# Patient Record
Sex: Male | Born: 1985 | Race: White | Hispanic: No | Marital: Single | Smoking: Current every day smoker
Health system: Southern US, Community
[De-identification: ages and names within clinical notes are randomized; demographics above are authoritative.]

## PROBLEM LIST (undated history)

## (undated) DIAGNOSIS — Y249XXA Unspecified firearm discharge, undetermined intent, initial encounter: Secondary | ICD-10-CM

## (undated) DIAGNOSIS — E119 Type 2 diabetes mellitus without complications: Secondary | ICD-10-CM

## (undated) DIAGNOSIS — W3400XA Accidental discharge from unspecified firearms or gun, initial encounter: Secondary | ICD-10-CM

## (undated) HISTORY — PX: COLOSTOMY: SHX63

## (undated) HISTORY — PX: COLOSTOMY REVERSAL: SHX5782

## (undated) HISTORY — PX: COLON RESECTION: SHX5231

---

## 2014-03-15 ENCOUNTER — Encounter (HOSPITAL_BASED_OUTPATIENT_CLINIC_OR_DEPARTMENT_OTHER): Payer: Self-pay | Admitting: *Deleted

## 2014-03-15 ENCOUNTER — Emergency Department (HOSPITAL_BASED_OUTPATIENT_CLINIC_OR_DEPARTMENT_OTHER)
Admission: EM | Admit: 2014-03-15 | Discharge: 2014-03-15 | Disposition: A | Discharge status: Home / Self Care | Payer: Self-pay | Attending: Emergency Medicine | Admitting: Emergency Medicine

## 2014-03-15 DIAGNOSIS — E119 Type 2 diabetes mellitus without complications: Secondary | ICD-10-CM | POA: Insufficient documentation

## 2014-03-15 DIAGNOSIS — Z794 Long term (current) use of insulin: Secondary | ICD-10-CM | POA: Insufficient documentation

## 2014-03-15 DIAGNOSIS — Z79899 Other long term (current) drug therapy: Secondary | ICD-10-CM | POA: Insufficient documentation

## 2014-03-15 DIAGNOSIS — L03115 Cellulitis of right lower limb: Secondary | ICD-10-CM | POA: Insufficient documentation

## 2014-03-15 DIAGNOSIS — Z720 Tobacco use: Secondary | ICD-10-CM | POA: Insufficient documentation

## 2014-03-15 DIAGNOSIS — L0291 Cutaneous abscess, unspecified: Secondary | ICD-10-CM

## 2014-03-15 HISTORY — DX: Type 2 diabetes mellitus without complications: E11.9

## 2014-03-15 LAB — CBG MONITORING, ED: GLUCOSE-CAPILLARY: 334 mg/dL — AB (ref 70–99)

## 2014-03-15 MED ORDER — LIDOCAINE-EPINEPHRINE 2 %-1:100000 IJ SOLN
20.0000 mL | Freq: Once | INTRAMUSCULAR | Status: AC
  Administered 2014-03-15: 20 mL

## 2014-03-15 MED ORDER — IBUPROFEN 800 MG PO TABS: 800.0000 mg | ORAL_TABLET | Freq: Three times a day (TID) | ORAL | Status: DC

## 2014-03-15 MED ORDER — SULFAMETHOXAZOLE-TRIMETHOPRIM 800-160 MG PO TABS: 1.0000 | ORAL_TABLET | Freq: Two times a day (BID) | ORAL | Status: DC

## 2014-03-15 MED ORDER — LIDOCAINE-EPINEPHRINE 2 %-1:100000 IJ SOLN
INTRAMUSCULAR | Status: AC
  Administered 2014-03-15: 20 mL
  Filled 2014-03-15: qty 1

## 2014-03-15 NOTE — ED Notes (Signed)
Pt amb to room 2 with quick steady gait in nad. Pt reports a swollen, tender area to his groin, first noticed in the shower last night. Denies any fevers or other c/o.

## 2014-03-15 NOTE — ED Provider Notes (Signed)
CSN: [id number]     Arrival date & time 03/15/14  0734 History   First MD Initiated Contact with Patient 03/15/14 0809     Chief Complaint  Patient presents with  . abcess      (Consider location/radiation/quality/duration/timing/severity/associated sxs/prior Treatment) HPI Patient has a one day development of a swollen nodule high on his right thigh. He reports is very tender to pressure.he reports he became very uncomfortable at work last night trying to walk around. He denies any other associated symptoms. He reports once he developed an abscess on the right side of his neck but it went away on its own. No prior history of recurrent abscess or MRSA. Past Medical History  Diagnosis Date  . Diabetes mellitus without complication    History reviewed. No pertinent past surgical history. History reviewed. No pertinent family history. History  Substance Use Topics  . Smoking status: Current Every Day Smoker  . Smokeless tobacco: Not on file  . Alcohol Use: Not on file    Review of Systems Constitutional: No fevers no chills, no general malaise. GI: No nausea vomiting or diarrhea.   Allergies  Review of patient's allergies indicates no known allergies.  Home Medications   Prior to Admission medications   Medication Sig Start Date End Date Taking? Authorizing Provider  insulin NPH-regular Human (NOVOLIN 70/30) (70-30) 100 UNIT/ML injection Inject 30 Units into the skin 2 (two) times daily with a meal.   Yes Historical Provider, MD  metFORMIN (GLUCOPHAGE) 1000 MG tablet Take 1,000 mg by mouth 2 (two) times daily with a meal.   Yes Historical Provider, MD   BP 137/79 mmHg  Pulse 88  Temp(Src) 98.3 F (36.8 C) (Oral)  SpO2 96% Physical Exam  Constitutional: He is oriented to person, place, and time. He appears well-developed and well-nourished. No distress.  HENT:  Head: Normocephalic.  Eyes: EOM are normal.  Neck: Neck supple.  Pulmonary/Chest: Effort normal. No  respiratory distress.  Abdominal: Soft. He exhibits no distension. There is no tenderness.  There is no groin lymphadenopathy or scrotal erythema or swelling.  Musculoskeletal: He exhibits no edema.  The patient has a 2-1/2 cm nodule high on the right thigh medial, about 8 cm from the groin crease. This is not within the inguinal region. The remainder of the leg is normal without cellulitis. The nodule itself has fluctuance. There is a pinpoint area that appears to have had a very small amount of drainage associated with it.  Lymphadenopathy:    He has no cervical adenopathy.  Neurological: He is alert and oriented to person, place, and time.  Skin: Skin is warm and dry.  Psychiatric: He has a normal mood and affect.    ED Course  Procedures (including critical care time) Labs Review Labs Reviewed  CBG MONITORING, ED - Abnormal; Notable for the following:    Glucose-Capillary 334 (*)    All other components within normal limits  WOUND CULTURE    Imaging Review No results found.   EKG Interpretation None     Procedure: Incision and drainage. Right upper thigh was prepped with Betadine. One milliliter of 2% lidocaine with epinephrine used for anesthesia 11 blade scalpel used for incision 3 milliliters of thick pus expressed from the wound. Minimal bleeding. Wound irrigated with sterile normal saline. Packed with iodoform quarter inch gauze. Wound dressed with gauze and paper tape MDM   Final diagnoses:  Abscess   Patient has isolated skin abscess without associated cellulitis at this point  time. There is a second follicle with a half centimeter papule associated with it adjacent to the main abscess. There is no fluctuance associated with this and thus cannot be drained at this time. There is no associated groin or genital findings at this time. There are no associated constitutional findings. The patient will be treated with Bactrim and instructed for a 48 hour recheck for  packing removal or change.    Arby BarretteMarcy Brownie Gockel, MD 03/15/14 [phone]

## 2014-03-15 NOTE — Discharge Instructions (Signed)
Abscess °Care After °An abscess (also called a boil or furuncle) is an infected area that contains a collection of pus. Signs and symptoms of an abscess include pain, tenderness, redness, or hardness, or you may feel a moveable soft area under your skin. An abscess can occur anywhere in the body. The infection may spread to surrounding tissues causing cellulitis. A cut (incision) by the surgeon was made over your abscess and the pus was drained out. Gauze may have been packed into the space to provide a drain that will allow the cavity to heal from the inside outwards. The boil may be painful for 5 to 7 days. Most people with a boil do not have high fevers. Your abscess, if seen early, may not have localized, and may not have been lanced. If not, another appointment may be required for this if it does not get better on its own or with medications. °HOME CARE INSTRUCTIONS  °· Only take over-the-counter or prescription medicines for pain, discomfort, or fever as directed by your caregiver. °· When you bathe, soak and then remove gauze or iodoform packs at least daily or as directed by your caregiver. You may then wash the wound gently with mild soapy water. Repack with gauze or do as your caregiver directs. °SEEK IMMEDIATE MEDICAL CARE IF:  °· You develop increased pain, swelling, redness, drainage, or bleeding in the wound site. °· You develop signs of generalized infection including muscle aches, chills, fever, or a general ill feeling. °· An oral temperature above 102° F (38.9° C) develops, not controlled by medication. °See your caregiver for a recheck if you develop any of the symptoms described above. If medications (antibiotics) were prescribed, take them as directed. °Document Released: 11/06/2004 Document Revised: 07/13/2011 Document Reviewed: 07/04/2007 °ExitCare® Patient Information ©2015 [employer]. This information is not intended to replace advice given to you by your health care provider. Make sure  you discuss any questions you have with your health care provider. ° ° °Emergency Department Resource Guide °1) Find a Doctor and Pay Out of Pocket °Although you won't have to find out who is covered by your insurance plan, it is a good idea to ask around and get recommendations. You will then need to call the office and see if the doctor you have chosen will accept you as a new patient and what types of options they offer for patients who are self-pay. Some doctors offer discounts or will set up payment plans for their patients who do not have insurance, but you will need to ask so you aren't surprised when you get to your appointment. ° °2) Contact Your Local Health Department °Not all health departments have doctors that can see patients for sick visits, but many do, so it is worth a call to see if yours does. If you don't know where your local health department is, you can check in your phone book. The CDC also has a tool to help you locate your state's health department, and many state websites also have listings of all of their local health departments. ° °3) Find a Walk-in Clinic °If your illness is not likely to be very severe or complicated, you may want to try a walk in clinic. These are popping up all over the country in pharmacies, drugstores, and shopping centers. They're usually staffed by nurse practitioners or physician assistants that have been trained to treat common illnesses and complaints. They're usually fairly quick and inexpensive. However, if you have serious medical issues   or chronic medical problems, these are probably not your best option. ° °No Primary Care Doctor: °- Call [facility] at  832-8000 - they can help you locate a primary care doctor that  accepts your insurance, provides certain services, etc. °- Physician Referral Service- 1-800-533-3463 ° °Chronic Pain Problems: °Organization         Address  Phone   Notes  °[facility]  (336) 297-2271 Patients need  to be referred by their primary care doctor.  ° °Medication Assistance: °Organization         Address  Phone   Notes  °[place] [facility] [address] °[place], [place] [address] (336) 641-8030 --Must be a resident of [place] °-- Must have NO insurance coverage whatsoever (no Medicaid/ Medicare, etc.) °-- The pt. MUST have a primary care doctor that directs their care regularly and follows them in the community °  °[facility]  (866) 331-1348   °[employer]  (888) 892-1162   ° °Agencies that provide inexpensive medical care: °Organization         Address  Phone   Notes  °[facility]  (336) 832-8035   °[facility]    (336) 832-7272   °[facility] [address] °[place], [place] [address] (336) 832-4777   °[facility] [address], °[place] (336) 271-4999   °[facility]    (336) 373-0678   °[facility]    (336) 272-1050   °[facility] ° [address], [place] Phone:  (336) 832-4444, Fax:  (336) 832-4440 Hours of Operation:  9 am - 6 pm, M-F.  Also accepts Medicaid/Medicare and self-pay.  °[facility] ° [address], [place] Phone: (336) 832-3150, Fax: (336) 832-3151. Hours of Operation:  8:30 am - 5:30 pm, M-F.  Also accepts Medicaid and self-pay.  °[facility] [place] [address], [place] Phone: (336) 878-6027   °[facility] [address], [place], [place] (336)723-1848, Ext. 123 Mondays & Thursdays: 7-9 AM.  First 15 patients are seen on a first come, first serve basis. °  ° °Medicaid-accepting [place] Providers: ° °Organization         Address  Phone   Notes  °[facility] [address], [place] (336) 641-2100 Also accepts self-pay patients.  °[facility] [address], [place] ° (336) 856-9996   °[facility] [address], [place] (336) 288-8857   °[facility] [address] [place] [address], [place] (336) 299-7000   °Veita Bland [address], [place]  ° (336) 373-1557 Only accepts [place] Access Medicaid patients after they have their name applied to their card.  ° °Self-Pay (no insurance) in [place]: ° °Organization         Address  Phone   Notes  °Sickle Cell Patients, [facility] [address], [place] (336) 832-1970   °[facility] [address], [place] (336) 832-4400   °[facility] ° [address], [place] (336) 992-4800   °[facility]/Dr. Osei-Bonsu ° [address] [place] [address], [place] or [address], [place] (336) 841-8500 Phone number for both [place] and [place] locations is the same.  °  [facility] [address], [place] (336) 299-0000   °[facility] [address] [place] [address], [place] or [address] (336) 852-7530 °(336) 878-2260   °[facility] [address], [place] (336) 350-1642, phone; (336) 294-5005, fax Sees patients 1st and 3rd Saturday of every month.  Must not qualify for public or private insurance (i.e. Medicaid, Medicare, [place] Health Choice, Veterans' Benefits) • Household income should be no more than 200% of the poverty level •The clinic cannot treat you if you are pregnant or think you are pregnant • Sexually transmitted diseases are not treated at the clinic.  ° ° °Dental Care: °Organization         Address  Phone  Notes  °[facility] [facility] [address], [place] (336) 641-6152 Accepts children up to age 21 who are enrolled in Medicaid or [place] Health Choice; pregnant women with a Medicaid card; and children who have applied for Medicaid or [place] Health Choice, but were declined, whose  parents can pay a reduced fee at time of service.  °[facility] [place]  [address], [place] (336) 641-7733 Accepts children up to age 21 who are enrolled in Medicaid or [place] Health Choice; pregnant women with a Medicaid card; and children who have applied for Medicaid or [place] Health Choice, but were declined, whose parents can pay a reduced fee at time of service.  °[facility] ° [address], [place] (336) 641-4533 Patients are seen by appointment only. Walk-ins are not accepted. [facility] will see patients 18 years of age and older. °Monday - Tuesday (8am-5pm) °Most Wednesdays (8:30-5pm) °$30 per visit, cash only  °[facility] ° [address], [place] (336) 641-4533 Patients are seen by appointment only. Walk-ins are not accepted. [facility] will see patients 18 years of age and older. °One Wednesday Evening (Monthly: Volunteer Based).  $30 per visit, cash only  °[facility]  (919) 537-3737 for adults; Children under age 4, call Graduate Pediatric Dentistry at (919) [phone]. Children aged 4-14, please call (919) [phone] to request a pediatric application. ° Dental services are provided in all areas of dental care including fillings, crowns and bridges, complete and partial dentures, implants, gum treatment, root canals, and extractions. Preventive care is also provided. Treatment is provided to both adults and children. °Patients are selected via a lottery and there is often a waiting list. °  °[facility] [address], °[place] ° (336) 763-8833 [email or web address] °  °[facility] [address], [place],  (336)723-1848, Ext. 123 Second and Fourth Thursday of each month, opens at 6:30 AM; Clinic ends at 9 AM.  Patients are seen on a first-come first-served basis, and a limited number are seen during each clinic.  ° °[facility] °  [address], [place],  (336) 723-7904   Eligibility Requirements °You must have lived in [place], [place], or [place] counties for at least the last three months. °  You cannot be eligible for state or federal sponsored healthcare insurance, including Veterans Administration, Medicaid, or Medicare. °  You generally cannot be eligible for healthcare insurance through your employer.  °  How to apply: °Eligibility screenings are held every Tuesday and Wednesday afternoon from 1:00 pm until 4:00 pm. You do not need an appointment for the interview!  °  [facility] [address], [place], [place] 336-631-2330   °[facility]  336-342-8273   °[facility]  336-703-3100   °[facility]  336-570-6415   ° °Behavioral Health Resources in the Community: °Intensive Outpatient Programs °Organization         Address  Phone  Notes  °[facility] [address], [place], [place] 336-878-6098   °[facility] [address], [place], [place] 336-832-9800   °[facility] [address], [place], [place] ° 336-882-2125   °[facility] [address],  °[place], [place] 1-800-853-5163 or 336-641-4981   °Substance Abuse Resources °Organization         Address  Phone  Notes  °[facility]  336-882-2125   °[facility]  336-784-9470   °[facility]  336-285-9073   °[facility]  336-845-3988   °Residential & Outpatient Substance Abuse Program  1-800-659-3381   °Psychological Services °Organization         Address  Phone  Notes  °[facility]  336- 832-9600   °[facility]  336- 378-7881   °[facility] [address], [place] 1-800-853-5163 or 336-641-4981   ° °Mobile Crisis Teams °Organization         Address  Phone  Notes  °[facility], Mobile Crisis Care Unit  1-877-626-1772     °Assertive °[facility] ° [address] [place], [place] 336-834-9664   °Sharon DeEsch [address] °[place] 336-554-5454   ° °Self-Help/Support Groups °Organization         Address  Phone             Notes  °[facility] - variety of support groups  336- 373-1402 Call for more information  °Narcotics Anonymous (NA), [facility] [address], °[place]  2 meetings at this location  ° °Residential Treatment Programs °Organization         Address  Phone  Notes  °[facility] [address],    °[place]  1-866-801-8205   °[facility] ° [address], [place], [place] 704-293-8524   °[facility] [address], [place] 336-845-3988 Admissions: 8am-3pm M-F  °[facility] [address]    °[place], [place] 336-841-1104   °[facility] [address], [place], [place] 336-379-7146   °[facility] [address]  °[place], [place] 336-285-9073   °[facility] Intensive Outpatient [address], [place], [place] 336-852-3033   °[facility] [address]  °[place], [place] 1-877-615-2722 or 336-784-9470   °[facility] [address] [place], [place] 336-227-7417 Accepts Medicaid  °[facility] [address]  °[place] 1-800-659-3381 Substance Abuse/Addiction Treatment  ° °[place] Behavioral Health Resources °Organization         Address  Phone  Notes  °[facility]  (888) 581-9988   °Julie Brannon, PhD [address] [place], [place]   (336) 349-5553 or (336) 951-0000   °[facility]   [address] °[place], [place] (336) 349-4454   °[facility] [address], [place], [place] (336) 342-8316 Insurance/Medicaid/sponsorship through [facility]  °[facility] [address]                                       [place], [place] (336) 342-8316 Therapy/tele-psych/case  °[facility] [address]  ° [place], [place] (336) 349-2233    °Dr. Arfeen  (336) 349-4544   °[facility] [place]  [employer] [place] [facility] 1) [address], [place] °2) [address], [place] °3)  [address] [place] [address], [place] (336) 349-3220 °(336) 342-7768 ° °(336) 342-8140   °[place] [facility] (336) 342-1394 or (336) 342-3537 (After Hours)    ° ° ° °

## 2014-03-17 ENCOUNTER — Encounter (HOSPITAL_BASED_OUTPATIENT_CLINIC_OR_DEPARTMENT_OTHER): Payer: Self-pay | Admitting: *Deleted

## 2014-03-17 ENCOUNTER — Emergency Department (HOSPITAL_BASED_OUTPATIENT_CLINIC_OR_DEPARTMENT_OTHER)
Admission: EM | Admit: 2014-03-17 | Discharge: 2014-03-17 | Disposition: A | Discharge status: Home / Self Care | Payer: Self-pay | Attending: Emergency Medicine | Admitting: Emergency Medicine

## 2014-03-17 DIAGNOSIS — Z794 Long term (current) use of insulin: Secondary | ICD-10-CM | POA: Insufficient documentation

## 2014-03-17 DIAGNOSIS — Z09 Encounter for follow-up examination after completed treatment for conditions other than malignant neoplasm: Secondary | ICD-10-CM

## 2014-03-17 DIAGNOSIS — Z792 Long term (current) use of antibiotics: Secondary | ICD-10-CM | POA: Insufficient documentation

## 2014-03-17 DIAGNOSIS — R739 Hyperglycemia, unspecified: Secondary | ICD-10-CM

## 2014-03-17 DIAGNOSIS — Z720 Tobacco use: Secondary | ICD-10-CM | POA: Insufficient documentation

## 2014-03-17 DIAGNOSIS — Z4801 Encounter for change or removal of surgical wound dressing: Secondary | ICD-10-CM | POA: Insufficient documentation

## 2014-03-17 DIAGNOSIS — Z791 Long term (current) use of non-steroidal anti-inflammatories (NSAID): Secondary | ICD-10-CM | POA: Insufficient documentation

## 2014-03-17 DIAGNOSIS — E1165 Type 2 diabetes mellitus with hyperglycemia: Secondary | ICD-10-CM | POA: Insufficient documentation

## 2014-03-17 LAB — URINALYSIS, ROUTINE W REFLEX MICROSCOPIC
Bilirubin Urine: NEGATIVE
Glucose, UA: >1000 mg/dL — AB
HGB URINE DIPSTICK: NEGATIVE
Ketones, ur: 15 mg/dL — AB
NITRITE: NEGATIVE
Protein, ur: NEGATIVE mg/dL
SPECIFIC GRAVITY, URINE: 1.041 — AB (ref 1.005–1.030)
Urobilinogen, UA: 1 mg/dL (ref 0.0–1.0)
pH: 6 (ref 5.0–8.0)

## 2014-03-17 LAB — BASIC METABOLIC PANEL
ANION GAP: 15 (ref 5–15)
BUN: 11 mg/dL (ref 6–23)
CHLORIDE: 96 meq/L (ref 96–112)
CO2: 23 mEq/L (ref 19–32)
CREATININE: 0.8 mg/dL (ref 0.50–1.35)
Calcium: 8.8 mg/dL (ref 8.4–10.5)
GFR calc Af Amer: >90 mL/min (ref >90)
GFR calc non Af Amer: >90 mL/min (ref >90)
Glucose, Bld: 534 mg/dL — ABNORMAL HIGH (ref 70–99)
Potassium: 4.1 mEq/L (ref 3.7–5.3)
Sodium: 134 mEq/L — ABNORMAL LOW (ref 137–147)

## 2014-03-17 LAB — CBG MONITORING, ED
Glucose-Capillary: 507 mg/dL — ABNORMAL HIGH (ref 70–99)
Glucose-Capillary: 458 mg/dL — ABNORMAL HIGH (ref 70–99)
Glucose-Capillary: 351 mg/dL — ABNORMAL HIGH (ref 70–99)

## 2014-03-17 LAB — WOUND CULTURE

## 2014-03-17 LAB — CBC WITH DIFFERENTIAL/PLATELET
Basophils Absolute: 0 10*3/uL (ref 0.0–0.1)
Basophils Relative: 1 % (ref 0–1)
Eosinophils Absolute: 0.2 10*3/uL (ref 0.0–0.7)
Eosinophils Relative: 3 % (ref 0–5)
HEMATOCRIT: 45.6 % (ref 39.0–52.0)
HEMOGLOBIN: 16.4 g/dL (ref 13.0–17.0)
LYMPHS PCT: 25 % (ref 12–46)
Lymphs Abs: 1.5 10*3/uL (ref 0.7–4.0)
MCH: 30.4 pg (ref 26.0–34.0)
MCHC: 36 g/dL (ref 30.0–36.0)
MCV: 84.4 fL (ref 78.0–100.0)
MONO ABS: 0.4 10*3/uL (ref 0.1–1.0)
MONOS PCT: 6 % (ref 3–12)
Neutro Abs: 4 10*3/uL (ref 1.7–7.7)
Neutrophils Relative %: 65 % (ref 43–77)
Platelets: 182 10*3/uL (ref 150–400)
RBC: 5.4 MIL/uL (ref 4.22–5.81)
RDW: 12.2 % (ref 11.5–15.5)
WBC: 6.1 10*3/uL (ref 4.0–10.5)

## 2014-03-17 LAB — URINE MICROSCOPIC-ADD ON

## 2014-03-17 MED ORDER — INSULIN ASPART PROT & ASPART (70-30 MIX) 100 UNIT/ML ~~LOC~~ SUSP
SUBCUTANEOUS | Status: AC
  Filled 2014-03-17: qty 10

## 2014-03-17 MED ORDER — SODIUM CHLORIDE 0.9 % IV BOLUS (SEPSIS)
1000.0000 mL | Freq: Once | INTRAVENOUS | Status: AC
  Administered 2014-03-17: 1000 mL via INTRAVENOUS

## 2014-03-17 MED ORDER — INSULIN REGULAR HUMAN 100 UNIT/ML IJ SOLN
10.0000 [IU] | Freq: Once | INTRAMUSCULAR | Status: AC
  Administered 2014-03-17: 10 [IU] via INTRAVENOUS
  Filled 2014-03-17: qty 1

## 2014-03-17 MED ORDER — INSULIN ASPART PROT & ASPART (70-30 MIX) 100 UNIT/ML ~~LOC~~ SUSP
30.0000 [IU] | Freq: Once | SUBCUTANEOUS | Status: AC
  Administered 2014-03-17: 30 [IU] via SUBCUTANEOUS

## 2014-03-17 NOTE — ED Notes (Signed)
Patient here today for recheck of abscess on R upper thigh

## 2014-03-17 NOTE — Discharge Instructions (Signed)
Abscess Care After An abscess (also called a boil or furuncle) is an infected area that contains a collection of pus. Signs and symptoms of an abscess include pain, tenderness, redness, or hardness, or you may feel a moveable soft area under your skin. An abscess can occur anywhere in the body. The infection may spread to surrounding tissues causing cellulitis. A cut (incision) by the surgeon was made over your abscess and the pus was drained out. Gauze may have been packed into the space to provide a drain that will allow the cavity to heal from the inside outwards. The boil may be painful for 5 to 7 days. Most people with a boil do not have high fevers. Your abscess, if seen early, may not have localized, and may not have been lanced. If not, another appointment may be required for this if it does not get better on its own or with medications. HOME CARE INSTRUCTIONS   Only take over-the-counter or prescription medicines for pain, discomfort, or fever as directed by your caregiver.  When you bathe, soak and then remove gauze or iodoform packs at least daily or as directed by your caregiver. You may then wash the wound gently with mild soapy water. Repack with gauze or do as your caregiver directs. SEEK IMMEDIATE MEDICAL CARE IF:   You develop increased pain, swelling, redness, drainage, or bleeding in the wound site.  You develop signs of generalized infection including muscle aches, chills, fever, or a general ill feeling.  An oral temperature above 102 F (38.9 C) develops, not controlled by medication. See your caregiver for a recheck if you develop any of the symptoms described above. If medications (antibiotics) were prescribed, take them as directed. Document Released: 11/06/2004 Document Revised: 07/13/2011 Document Reviewed: 07/04/2007 [facility] Patient Information 2015 [employer] [place]. This information is not intended to replace advice given to you by your health care provider. Make sure  you discuss any questions you have with your health care provider.  Hyperglycemia Hyperglycemia occurs when the glucose (sugar) in your blood is too high. Hyperglycemia can happen for many reasons, but it most often happens to people who do not know they have diabetes or are not managing their diabetes properly.  CAUSES  Whether you have diabetes or not, there are other causes of hyperglycemia. Hyperglycemia can occur when you have diabetes, but it can also occur in other situations that you might not be as aware of, such as: Diabetes  If you have diabetes and are having problems controlling your blood glucose, hyperglycemia could occur because of some of the following reasons:  Not following your meal plan.  Not taking your diabetes medications or not taking it properly.  Exercising less or doing less activity than you normally do.  Being sick. Pre-diabetes  This cannot be ignored. Before people develop Type 2 diabetes, they almost always have "pre-diabetes." This is when your blood glucose levels are higher than normal, but not yet high enough to be diagnosed as diabetes. Research has shown that some long-term damage to the body, especially the heart and circulatory system, may already be occurring during pre-diabetes. If you take action to manage your blood glucose when you have pre-diabetes, you may delay or prevent Type 2 diabetes from developing. Stress  If you have diabetes, you may be "diet" controlled or on oral medications or insulin to control your diabetes. However, you may find that your blood glucose is higher than usual in the hospital whether you have diabetes or not.  This is often referred to as "stress hyperglycemia." Stress can elevate your blood glucose. This happens because of hormones put out by the body during times of stress. If stress has been the cause of your high blood glucose, it can be followed regularly by your caregiver. That way he/she can make sure your  hyperglycemia does not continue to get worse or progress to diabetes. Steroids  Steroids are medications that act on the infection fighting system (immune system) to block inflammation or infection. One side effect can be a rise in blood glucose. Most people can produce enough extra insulin to allow for this rise, but for those who cannot, steroids make blood glucose levels go even higher. It is not unusual for steroid treatments to "uncover" diabetes that is developing. It is not always possible to determine if the hyperglycemia will go away after the steroids are stopped. A special blood test called an A1c is sometimes done to determine if your blood glucose was elevated before the steroids were started. SYMPTOMS  Thirsty.  Frequent urination.  Dry mouth.  Blurred vision.  Tired or fatigue.  Weakness.  Sleepy.  Tingling in feet or leg. DIAGNOSIS  Diagnosis is made by monitoring blood glucose in one or all of the following ways:  A1c test. This is a chemical found in your blood.  Fingerstick blood glucose monitoring.  Laboratory results. TREATMENT  First, knowing the cause of the hyperglycemia is important before the hyperglycemia can be treated. Treatment may include, but is not be limited to:  Education.  Change or adjustment in medications.  Change or adjustment in meal plan.  Treatment for an illness, infection, etc.  More frequent blood glucose monitoring.  Change in exercise plan.  Decreasing or stopping steroids.  Lifestyle changes. HOME CARE INSTRUCTIONS   Test your blood glucose as directed.  Exercise regularly. Your caregiver will give you instructions about exercise. Pre-diabetes or diabetes which comes on with stress is helped by exercising.  Eat wholesome, balanced meals. Eat often and at regular, fixed times. Your caregiver or nutritionist will give you a meal plan to guide your sugar intake.  Being at an ideal weight is important. If needed,  losing as little as 10 to 15 pounds may help improve blood glucose levels. SEEK MEDICAL CARE IF:   You have questions about medicine, activity, or diet.  You continue to have symptoms (problems such as increased thirst, urination, or weight gain). SEEK IMMEDIATE MEDICAL CARE IF:   You are vomiting or have diarrhea.  Your breath smells fruity.  You are breathing faster or slower.  You are very sleepy or incoherent.  You have numbness, tingling, or pain in your feet or hands.  You have chest pain.  Your symptoms get worse even though you have been following your caregiver's orders.  If you have any other questions or concerns. Document Released: 10/14/2000 Document Revised: 07/13/2011 Document Reviewed: 08/17/2011 [facility] Patient Information 2015 [place], [place]. This information is not intended to replace advice given to you by your health care provider. Make sure you discuss any questions you have with your health care provider.

## 2014-03-17 NOTE — ED Provider Notes (Addendum)
TIME SEEN: 9:05 AM  CHIEF COMPLAINT: abscess recheck  HPI: Pt is a 28 y.o. M with history of insulin-dependent diabetes who presents emergency department with abscess to the right inner thigh. He was seen 2 days ago and had this area I&D'ed. He was told to come back in 2 days for recheck and have his packing removed.  He has been on antibiotics, Septra. He states that he is feeling well. No fevers, chills, nausea, vomiting or diarrhea. Blood sugars have been under control.  He feels like this area is improving.  ROS: See HPI Constitutional: no fever  Eyes: no drainage  ENT: no runny nose   Cardiovascular:  no chest pain  Resp: no SOB  GI: no vomiting GU: no dysuria Integumentary: no rash  Allergy: no hives  Musculoskeletal: no leg swelling  Neurological: no slurred speech ROS otherwise negative  PAST MEDICAL HISTORY/PAST SURGICAL HISTORY:  Past Medical History  Diagnosis Date  . Diabetes mellitus without complication     MEDICATIONS:  Prior to Admission medications   Medication Sig Start Date End Date Taking? Authorizing Provider  ibuprofen (ADVIL,MOTRIN) 800 MG tablet Take 1 tablet (800 mg total) by mouth 3 (three) times daily. 03/15/14   Arby BarretteMarcy Pfeiffer, MD  insulin NPH-regular Human (NOVOLIN 70/30) (70-30) 100 UNIT/ML injection Inject 30 Units into the skin 2 (two) times daily with a meal.    Historical Provider, MD  metFORMIN (GLUCOPHAGE) 1000 MG tablet Take 1,000 mg by mouth 2 (two) times daily with a meal.    Historical Provider, MD  sulfamethoxazole-trimethoprim (SEPTRA DS) 800-160 MG per tablet Take 1 tablet by mouth every 12 (twelve) hours. 03/15/14   Arby BarretteMarcy Pfeiffer, MD    ALLERGIES:  No Known Allergies  SOCIAL HISTORY:  History  Substance Use Topics  . Smoking status: Current Every Day Smoker  . Smokeless tobacco: Not on file  . Alcohol Use: Not on file    FAMILY HISTORY: No family history on file.  EXAM: BP 159/84 mmHg  Pulse 102  Temp(Src) 98.2 F (36.8  C) (Oral)  Resp 20  Ht 5\' 8"  (1.727 m)  Wt 205 lb (92.987 kg)  BMI 31.18 kg/m2  SpO2 96% CONSTITUTIONAL: Alert and oriented and responds appropriately to questions. Well-appearing; well-nourished HEAD: Normocephalic EYES: Conjunctivae clear, PERRL ENT: normal nose; no rhinorrhea; moist mucous membranes; pharynx without lesions noted NECK: Supple, no meningismus, no LAD  CARD: RRR; S1 and S2 appreciated; no murmurs, no clicks, no rubs, no gallops RESP: Normal chest excursion without splinting or tachypnea; breath sounds clear and equal bilaterally; no wheezes, no rhonchi, no rales,  ABD/GI: Normal bowel sounds; non-distended; soft, non-tender, no rebound, no guarding BACK:  The back appears normal and is non-tender to palpation, there is no CVA tenderness EXT: Normal ROM in all joints; non-tender to palpation; no edema; normal capillary refill; no cyanosis    SKIN: Normal color for age and race; warm; 1 cm open area to the inner right thigh with a minimal amount of purulent drainage with packing still in the wound, there is also a half centimeter papule adjacent to this area that appears that his artery drained as it does have a small scab on top of it and has no active drainage, there is no signs of surrounding cellulitis or induration, no current fluctuance, no perineal warmth or erythema or crepitus NEURO: Moves all extremities equally PSYCH: The patient's mood and manner are appropriate. Grooming and personal hygiene are appropriate.  MEDICAL DECISION MAKING: Pt isn't  insulin-dependent diabetic who is here for a wound recheck. He had an abscess drained 2 days ago. He has been on antibiotics. He is well-appearing without systemic complaints. Packing removed from patient's abscess. His blood sugar however today is 504. He reports he did take his metformin but has not taken his insulin today. Will check for signs of DKA. We'll give IV fluids, insulin.  10:38 AM  Pt's labs are reassuring. No  leukocytosis. His bicarbonate is 23, anion gap 15. We'll recheck blood glucose after IV fluids. He was given his dose of 30 units of NovoLog 70/30.   11:15 AM  Pt's glucose is still almost 500. He does appear to have a UTI. Culture pending. He is currently on Septra. We'll have him continue this antibiotic. We'll give 10 units of IV regular insulin for his hyperglycemia. Suspect there is some medication noncompliance.   12:30 PM  BG is 351.  O feel he is safe to be dc'ed home.  Have instructed him to continue his antibiotics as prescribed and to continue warm compresses, warm soaks. Have discussed strict return precautions. He verbalizes understanding and is comfortable with plan.  Advised him to f/u with his PCP for hyperglycemia.       Layla MawKristen N Ward, DO 03/17/14 [id number]  Layla MawKristen N Ward, DO 03/17/14 [phone]

## 2014-03-18 ENCOUNTER — Telehealth (HOSPITAL_BASED_OUTPATIENT_CLINIC_OR_DEPARTMENT_OTHER): Payer: Self-pay | Admitting: Emergency Medicine

## 2014-03-18 ENCOUNTER — Telehealth: Payer: Self-pay | Admitting: Emergency Medicine

## 2014-03-18 LAB — URINE CULTURE: Colony Count: 9000

## 2014-03-18 NOTE — Progress Notes (Cosign Needed)
ED Antimicrobial Stewardship Positive Culture Follow Up   Dominic Hinton is an 28 y.o. male [facility] with a chief complaint of  Chief Complaint  Patient presents with  . abcess     Recent Results (from the past 720 hour(s))  Wound culture     Status: None   Collection Time: 03/15/14  8:35 AM  Result Value Ref Range Status   Specimen Description ABSCESS  Final   Special Requests NONE  Final   Gram Stain   Final    ABUNDANT WBC PRESENT, PREDOMINANTLY PMN NO SQUAMOUS EPITHELIAL CELLS SEEN MODERATE GRAM POSITIVE COCCI IN PAIRS [facility]    Culture   Final    MODERATE GROUP B STREP(S.AGALACTIAE)ISOLATED Note: TESTING AGAINST S. AGALACTIAE NOT ROUTINELY PERFORMED DUE TO PREDICTABILITY OF AMP/PEN/VAN SUSCEPTIBILITY. Performed at [facility]    Report Status 03/17/2014 FINAL  Final    [x]  Treated with septra, organism resistant to prescribed antimicrobial Pt came in with thigh abscess and got I&D. He was sent out on septra for cover for cellulitis. Culture has come back with mod group B strep which is universally resistant to it. Will change to keflex.   New antibiotic prescription:   Dc septra Start Keflex 500mg  PO TID x 10 days  ED Provider: Santiago GladHeather Laisure, PA  Ulyses SouthwardMinh Pham, PharmD Pager: [phone] Infectious Diseases Pharmacist Phone# [phone]

## 2014-03-18 NOTE — Telephone Encounter (Signed)
Post ED Visit - Positive Culture Follow-up: Successful Patient Follow-Up  Culture assessed and recommendations reviewed by: []  Wes Dulaney, Pharm.D., BCPS []  Celedonio MiyamotoJeremy Frens, Pharm.D., BCPS []  Georgina PillionElizabeth Martin, [address]Pharm[address]D., BCPS [x]  Lac du FlambeauMinh Pham, [address]Pharm[address]D., BCPS, AAHIVP []  Estella HuskMichelle Turner, Pharm.D., BCPS, AAHIVP []  Red ChristiansSamson Lee, Pharm.D. []  Tennis Mustassie Stewart, Pharm.D.  Positive Wound culture  []  Patient discharged without antimicrobial prescription and treatment is now indicated [x]  Organism is resistant to prescribed ED discharge antimicrobial []  Patient with positive blood cultures  Changes discussed with ED provider: Santiago GladHeather Laisure PA New antibiotic prescription:  Keflex 500 mg PO BID x 10 days [facility] [phone]  Contacted patient, date 03/18/14, time 1630  ID verified, patient returned call. Notified of positive wound culture and need to change antibiotic. Instructed to stop Septra and start Keflex. [place] [facility] [place].  Jiles HaroldGammons, Sofiya Ezelle Chaney 03/18/2014, 4:32 PM

## 2015-02-11 ENCOUNTER — Encounter (HOSPITAL_BASED_OUTPATIENT_CLINIC_OR_DEPARTMENT_OTHER): Payer: Self-pay | Admitting: *Deleted

## 2015-02-11 ENCOUNTER — Emergency Department (HOSPITAL_BASED_OUTPATIENT_CLINIC_OR_DEPARTMENT_OTHER)
Admission: EM | Admit: 2015-02-11 | Discharge: 2015-02-11 | Disposition: A | Discharge status: Home / Self Care | Payer: Self-pay | Attending: Emergency Medicine | Admitting: Emergency Medicine

## 2015-02-11 DIAGNOSIS — Z720 Tobacco use: Secondary | ICD-10-CM | POA: Insufficient documentation

## 2015-02-11 DIAGNOSIS — Z794 Long term (current) use of insulin: Secondary | ICD-10-CM | POA: Insufficient documentation

## 2015-02-11 DIAGNOSIS — E1165 Type 2 diabetes mellitus with hyperglycemia: Secondary | ICD-10-CM | POA: Insufficient documentation

## 2015-02-11 DIAGNOSIS — J069 Acute upper respiratory infection, unspecified: Secondary | ICD-10-CM | POA: Insufficient documentation

## 2015-02-11 DIAGNOSIS — R739 Hyperglycemia, unspecified: Secondary | ICD-10-CM

## 2015-02-11 LAB — CBG MONITORING, ED: Glucose-Capillary: 392 mg/dL — ABNORMAL HIGH (ref 65–99)

## 2015-02-11 LAB — RAPID STREP SCREEN (MED CTR MEBANE ONLY): STREPTOCOCCUS, GROUP A SCREEN (DIRECT): NEGATIVE

## 2015-02-11 MED ORDER — IBUPROFEN 400 MG PO TABS
400.0000 mg | ORAL_TABLET | Freq: Once | ORAL | Status: AC
  Administered 2015-02-11: 400 mg via ORAL
  Filled 2015-02-11: qty 1

## 2015-02-11 NOTE — Discharge Instructions (Signed)
Hyperglycemia °Hyperglycemia occurs when the glucose (sugar) in your blood is too high. Hyperglycemia can happen for many reasons, but it most often happens to people who do not know they have diabetes or are not managing their diabetes properly.  °CAUSES  °Whether you have diabetes or not, there are other causes of hyperglycemia. Hyperglycemia can occur when you have diabetes, but it can also occur in other situations that you might not be as aware of, such as: °Diabetes °· If you have diabetes and are having problems controlling your blood glucose, hyperglycemia could occur because of some of the following reasons: °· Not following your meal plan. °· Not taking your diabetes medications or not taking it properly. °· Exercising less or doing less activity than you normally do. °· Being sick. °Pre-diabetes °· This cannot be ignored. Before people develop Type 2 diabetes, they almost always have "pre-diabetes." This is when your blood glucose levels are higher than normal, but not yet high enough to be diagnosed as diabetes. Research has shown that some long-term damage to the body, especially the heart and circulatory system, may already be occurring during pre-diabetes. If you take action to manage your blood glucose when you have pre-diabetes, you may delay or prevent Type 2 diabetes from developing. °Stress °· If you have diabetes, you may be "diet" controlled or on oral medications or insulin to control your diabetes. However, you may find that your blood glucose is higher than usual in the hospital whether you have diabetes or not. This is often referred to as "stress hyperglycemia." Stress can elevate your blood glucose. This happens because of hormones put out by the body during times of stress. If stress has been the cause of your high blood glucose, it can be followed regularly by your caregiver. That way he/she can make sure your hyperglycemia does not continue to get worse or progress to  diabetes. °Steroids °· Steroids are medications that act on the infection fighting system (immune system) to block inflammation or infection. One side effect can be a rise in blood glucose. Most people can produce enough extra insulin to allow for this rise, but for those who cannot, steroids make blood glucose levels go even higher. It is not unusual for steroid treatments to "uncover" diabetes that is developing. It is not always possible to determine if the hyperglycemia will go away after the steroids are stopped. A special blood test called an A1c is sometimes done to determine if your blood glucose was elevated before the steroids were started. °SYMPTOMS °· Thirsty. °· Frequent urination. °· Dry mouth. °· Blurred vision. °· Tired or fatigue. °· Weakness. °· Sleepy. °· Tingling in feet or leg. °DIAGNOSIS  °Diagnosis is made by monitoring blood glucose in one or all of the following ways: °· A1c test. This is a chemical found in your blood. °· Fingerstick blood glucose monitoring. °· Laboratory results. °TREATMENT  °First, knowing the cause of the hyperglycemia is important before the hyperglycemia can be treated. Treatment may include, but is not be limited to: °· Education. °· Change or adjustment in medications. °· Change or adjustment in meal plan. °· Treatment for an illness, infection, etc. °· More frequent blood glucose monitoring. °· Change in exercise plan. °· Decreasing or stopping steroids. °· Lifestyle changes. °HOME CARE INSTRUCTIONS  °· Test your blood glucose as directed. °· Exercise regularly. Your caregiver will give you instructions about exercise. Pre-diabetes or diabetes which comes on with stress is helped by exercising. °· Eat wholesome,   balanced meals. Eat often and at regular, fixed times. Your caregiver or nutritionist will give you a meal plan to guide your sugar intake.  Being at an ideal weight is important. If needed, losing as little as 10 to 15 pounds may help improve blood  glucose levels. SEEK MEDICAL CARE IF:   You have questions about medicine, activity, or diet.  You continue to have symptoms (problems such as increased thirst, urination, or weight gain). SEEK IMMEDIATE MEDICAL CARE IF:   You are vomiting or have diarrhea.  Your breath smells fruity.  You are breathing faster or slower.  You are very sleepy or incoherent.  You have numbness, tingling, or pain in your feet or hands.  You have chest pain.  Your symptoms get worse even though you have been following your caregiver's orders.  If you have any other questions or concerns.   This information is not intended to replace advice given to you by your health care provider. Make sure you discuss any questions you have with your health care provider.   Document Released: 10/14/2000 Document Revised: 07/13/2011 Document Reviewed: 12/25/2014 Elsevier Interactive Patient Education 2016 [employer]  Upper Respiratory Infection, Adult Most upper respiratory infections (URIs) are a viral infection of the air passages leading to the lungs. A URI affects the nose, throat, and upper air passages. The most common type of URI is nasopharyngitis and is typically referred to as "the common cold." URIs run their course and usually go away on their own. Most of the time, a URI does not require medical attention, but sometimes a bacterial infection in the upper airways can follow a viral infection. This is called a secondary infection. Sinus and middle ear infections are common types of secondary upper respiratory infections. Bacterial pneumonia can also complicate a URI. A URI can worsen asthma and chronic obstructive pulmonary disease (COPD). Sometimes, these complications can require emergency medical care and may be life threatening.  CAUSES Almost all URIs are caused by viruses. A virus is a type of germ and can spread from one person to another.  RISKS FACTORS You may be at risk for a URI if:   You  smoke.   You have chronic heart or lung disease.  You have a weakened defense (immune) system.   You are very young or very old.   You have nasal allergies or asthma.  You work in crowded or poorly ventilated areas.  You work in health care facilities or schools. SIGNS AND SYMPTOMS  Symptoms typically develop 2-3 days after you come in contact with a cold virus. Most viral URIs last 7-10 days. However, viral URIs from the influenza virus (flu virus) can last 14-18 days and are typically more severe. Symptoms may include:   Runny or stuffy (congested) nose.   Sneezing.   Cough.   Sore throat.   Headache.   Fatigue.   Fever.   Loss of appetite.   Pain in your forehead, behind your eyes, and over your cheekbones (sinus pain).  Muscle aches.  DIAGNOSIS  Your health care provider may diagnose a URI by:  Physical exam.  Tests to check that your symptoms are not due to another condition such as:  Strep throat.  Sinusitis.  Pneumonia.  Asthma. TREATMENT  A URI goes away on its own with time. It cannot be cured with medicines, but medicines may be prescribed or recommended to relieve symptoms. Medicines may help:  Reduce your fever.  Reduce your cough.  Relieve  nasal congestion. HOME CARE INSTRUCTIONS   Take medicines only as directed by your health care provider.   Gargle warm saltwater or take cough drops to comfort your throat as directed by your health care provider.  Use a warm mist humidifier or inhale steam from a shower to increase air moisture. This may make it easier to breathe.  Drink enough fluid to keep your urine clear or pale yellow.   Eat soups and other clear broths and maintain good nutrition.   Rest as needed.   Return to work when your temperature has returned to normal or as your health care provider advises. You may need to stay home longer to avoid infecting others. You can also use a face mask and careful hand  washing to prevent spread of the virus.  Increase the usage of your inhaler if you have asthma.   Do not use any tobacco products, including cigarettes, chewing tobacco, or electronic cigarettes. If you need help quitting, ask your health care provider. PREVENTION  The best way to protect yourself from getting a cold is to practice good hygiene.   Avoid oral or hand contact with people with cold symptoms.   Wash your hands often if contact occurs.  There is no clear evidence that vitamin C, vitamin E, echinacea, or exercise reduces the chance of developing a cold. However, it is always recommended to get plenty of rest, exercise, and practice good nutrition.  SEEK MEDICAL CARE IF:   You are getting worse rather than better.   Your symptoms are not controlled by medicine.   You have chills.  You have worsening shortness of breath.  You have brown or red mucus.  You have yellow or brown nasal discharge.  You have pain in your face, especially when you bend forward.  You have a fever.  You have swollen neck glands.  You have pain while swallowing.  You have white areas in the back of your throat. SEEK IMMEDIATE MEDICAL CARE IF:   You have severe or persistent:  Headache.  Ear pain.  Sinus pain.  Chest pain.  You have chronic lung disease and any of the following:  Wheezing.  Prolonged cough.  Coughing up blood.  A change in your usual mucus.  You have a stiff neck.  You have changes in your:  Vision.  Hearing.  Thinking.  Mood. MAKE SURE YOU:   Understand these instructions.  Will watch your condition.  Will get help right away if you are not doing well or get worse.   This information is not intended to replace advice given to you by your health care provider. Make sure you discuss any questions you have with your health care provider.   Document Released: 10/14/2000 Document Revised: 09/04/2014 Document Reviewed: 07/26/2013 Elsevier  Interactive Patient Education [employer]

## 2015-02-11 NOTE — ED Notes (Signed)
C/o chills,  NP cough, feels weak started Friday. C/o sorethroat. Body aches.

## 2015-02-11 NOTE — ED Provider Notes (Signed)
CSN: [id number]     Arrival date & time 02/11/15  4098 History   First MD Initiated Contact with Patient 02/11/15 [id number]     No chief complaint on file.    (Consider location/radiation/quality/duration/timing/severity/associated sxs/prior Treatment) HPI Comments: Patient presents with a 2 day history of runny nose and sore throat. He's had associated myalgias and subjective fevers. There is no vomiting or diarrhea. His cough is nonproductive. He denies any shortness of breath or chest congestion. He is not taking anything at home for symptomatic relief. He recently was exposed to a URI from his girlfriend. He has no difficulty swallowing or swelling to his throat.   Past Medical History  Diagnosis Date  . Diabetes mellitus without complication (HCC)    History reviewed. No pertinent past surgical history. No family history on file. Social History  Substance Use Topics  . Smoking status: Current Every Day Smoker -- 1.00 packs/day    Types: Cigarettes  . Smokeless tobacco: None  . Alcohol Use: No    Review of Systems  Constitutional: Positive for fever and fatigue. Negative for chills and diaphoresis.  HENT: Positive for congestion, postnasal drip, rhinorrhea and sore throat. Negative for sneezing.   Eyes: Negative.   Respiratory: Positive for cough. Negative for chest tightness and shortness of breath.   Cardiovascular: Negative for chest pain and leg swelling.  Gastrointestinal: Negative for nausea, vomiting, abdominal pain, diarrhea and blood in stool.  Genitourinary: Negative for frequency, hematuria, flank pain and difficulty urinating.  Musculoskeletal: Positive for myalgias. Negative for back pain and arthralgias.  Skin: Negative for rash.  Neurological: Negative for dizziness, speech difficulty, weakness, numbness and headaches.      Allergies  Review of patient's allergies indicates no known allergies.  Home Medications   Prior to Admission medications   Medication  Sig Start Date End Date Taking? Authorizing Provider  insulin NPH-regular Human (NOVOLIN 70/30) (70-30) 100 UNIT/ML injection Inject 30 Units into the skin 2 (two) times daily with a meal.   Yes Historical Provider, MD   BP 118/62 mmHg  Pulse 82  Temp(Src) 98.2 F (36.8 C) (Oral)  Resp 16  Ht  (1.727 m)  Wt 200 lb (90.719 kg)  BMI 30.42 kg/m2  SpO2 98% Physical Exam  Constitutional: He is oriented to person, place, and time. He appears well-developed and well-nourished.  HENT:  Head: Normocephalic and atraumatic.  Mild erythema to posterior pharynx, no exudates, uvula is midline, no trismus  Eyes: Pupils are equal, round, and reactive to light.  Neck: Normal range of motion. Neck supple.  Cardiovascular: Normal rate, regular rhythm and normal heart sounds.   Pulmonary/Chest: Effort normal and breath sounds normal. No respiratory distress. He has no wheezes. He has no rales. He exhibits no tenderness.  Abdominal: Soft. Bowel sounds are normal. There is no tenderness. There is no rebound and no guarding.  Musculoskeletal: Normal range of motion. He exhibits no edema.  Lymphadenopathy:    He has no cervical adenopathy.  Neurological: He is alert and oriented to person, place, and time.  Skin: Skin is warm and dry. No rash noted.  Psychiatric: He has a normal mood and affect.    ED Course  Procedures (including critical care time) Labs Review Labs Reviewed  CBG MONITORING, ED - Abnormal; Notable for the following:    Glucose-Capillary 392 (*)    All other components within normal limits  RAPID STREP SCREEN (NOT AT [facility])  CULTURE, GROUP A STREP    Imaging  Review No results found. I have personally reviewed and evaluated these images and lab results as part of my medical decision-making.   EKG Interpretation None      MDM   Final diagnoses:  URI (upper respiratory infection)  Hyperglycemia    Patient's rapid strep is negative. His lungs are clear without  evidence of pneumonia. He's well appearing without signs of toxicity. He was discharged home in good condition. He was advised in symptomatic treatment of his URI. His blood sugar was noted to be elevated as it has been on prior visits. I had a long discussion with him on appropriate management of his diabetes. It does not sound like he's compliant with his medication. He states he does have insulin at home but needs to get needles. I encouraged him to take his treatment of diabetes seriously and I gave him outpatient resources for follow-up.    Rolan Bucco, MD 02/11/15 [phone]

## 2015-02-11 NOTE — ED Notes (Signed)
MD at bedside. 

## 2015-02-13 LAB — CULTURE, GROUP A STREP: Strep A Culture: NEGATIVE

## 2017-02-03 ENCOUNTER — Emergency Department (HOSPITAL_BASED_OUTPATIENT_CLINIC_OR_DEPARTMENT_OTHER)
Admission: EM | Admit: 2017-02-03 | Discharge: 2017-02-03 | Disposition: A | Discharge status: Home / Self Care | Payer: Self-pay | Attending: Emergency Medicine | Admitting: Emergency Medicine

## 2017-02-03 ENCOUNTER — Encounter (HOSPITAL_BASED_OUTPATIENT_CLINIC_OR_DEPARTMENT_OTHER): Payer: Self-pay | Admitting: *Deleted

## 2017-02-03 ENCOUNTER — Emergency Department (HOSPITAL_BASED_OUTPATIENT_CLINIC_OR_DEPARTMENT_OTHER): Payer: Self-pay

## 2017-02-03 DIAGNOSIS — Z7982 Long term (current) use of aspirin: Secondary | ICD-10-CM | POA: Insufficient documentation

## 2017-02-03 DIAGNOSIS — G8929 Other chronic pain: Secondary | ICD-10-CM | POA: Insufficient documentation

## 2017-02-03 DIAGNOSIS — M545 Low back pain, unspecified: Secondary | ICD-10-CM

## 2017-02-03 DIAGNOSIS — F17210 Nicotine dependence, cigarettes, uncomplicated: Secondary | ICD-10-CM | POA: Insufficient documentation

## 2017-02-03 DIAGNOSIS — M62830 Muscle spasm of back: Secondary | ICD-10-CM | POA: Insufficient documentation

## 2017-02-03 DIAGNOSIS — Z79899 Other long term (current) drug therapy: Secondary | ICD-10-CM | POA: Insufficient documentation

## 2017-02-03 DIAGNOSIS — E119 Type 2 diabetes mellitus without complications: Secondary | ICD-10-CM | POA: Insufficient documentation

## 2017-02-03 DIAGNOSIS — Z794 Long term (current) use of insulin: Secondary | ICD-10-CM | POA: Insufficient documentation

## 2017-02-03 HISTORY — DX: Unspecified firearm discharge, undetermined intent, initial encounter: Y24.9XXA

## 2017-02-03 HISTORY — DX: Accidental discharge from unspecified firearms or gun, initial encounter: W34.00XA

## 2017-02-03 LAB — CBC WITH DIFFERENTIAL/PLATELET
BASOS PCT: 1 %
Basophils Absolute: 0.1 10*3/uL (ref 0.0–0.1)
EOS ABS: 0.2 10*3/uL (ref 0.0–0.7)
Eosinophils Relative: 2 %
HCT: 47 % (ref 39.0–52.0)
HEMOGLOBIN: 17 g/dL (ref 13.0–17.0)
LYMPHS ABS: 2.6 10*3/uL (ref 0.7–4.0)
Lymphocytes Relative: 26 %
MCH: 30.2 pg (ref 26.0–34.0)
MCHC: 36.2 g/dL — AB (ref 30.0–36.0)
MCV: 83.5 fL (ref 78.0–100.0)
Monocytes Absolute: 0.6 10*3/uL (ref 0.1–1.0)
Monocytes Relative: 6 %
Neutro Abs: 6.6 10*3/uL (ref 1.7–7.7)
Neutrophils Relative %: 65 %
Platelets: 182 10*3/uL (ref 150–400)
RBC: 5.63 MIL/uL (ref 4.22–5.81)
RDW: 12.5 % (ref 11.5–15.5)
WBC: 10.1 10*3/uL (ref 4.0–10.5)

## 2017-02-03 LAB — BASIC METABOLIC PANEL
Anion gap: 6 (ref 5–15)
BUN: 12 mg/dL (ref 6–20)
CHLORIDE: 100 mmol/L — AB (ref 101–111)
CO2: 27 mmol/L (ref 22–32)
Calcium: 8.6 mg/dL — ABNORMAL LOW (ref 8.9–10.3)
Creatinine, Ser: 0.91 mg/dL (ref 0.61–1.24)
GFR calc Af Amer: >60 mL/min (ref >60)
GFR calc non Af Amer: >60 mL/min (ref >60)
GLUCOSE: 276 mg/dL — AB (ref 65–99)
Potassium: 4.3 mmol/L (ref 3.5–5.1)
Sodium: 133 mmol/L — ABNORMAL LOW (ref 135–145)

## 2017-02-03 LAB — URINALYSIS, MICROSCOPIC (REFLEX): RBC / HPF: NONE SEEN RBC/hpf (ref 0–5)

## 2017-02-03 LAB — URINALYSIS, ROUTINE W REFLEX MICROSCOPIC
BILIRUBIN URINE: NEGATIVE
Hgb urine dipstick: NEGATIVE
KETONES UR: 15 mg/dL — AB
Nitrite: NEGATIVE
Protein, ur: NEGATIVE mg/dL
Specific Gravity, Urine: 1.01 (ref 1.005–1.030)
pH: 5.5 (ref 5.0–8.0)

## 2017-02-03 MED ORDER — CYCLOBENZAPRINE HCL 10 MG PO TABS: 10.0000 mg | ORAL_TABLET | Freq: Two times a day (BID) | ORAL | 0 refills | Status: AC | PRN

## 2017-02-03 NOTE — ED Notes (Addendum)
Patient transported amb  to X-ray 

## 2017-02-03 NOTE — Discharge Instructions (Signed)
Based on your exam and reassuring imaging, we suspect you have a muscle spasm in her back causing your pain. Please use the muscle relaxant to help with your symptoms. Please follow-up with a primary care physician for further management. If any symptoms change or worsen, please return to the nearest emergency department.
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
## 2017-02-03 NOTE — ED Triage Notes (Signed)
Pt c/o lower back pain increased pain with movt x 1 week

## 2018-11-09 IMAGING — CR DG LUMBAR SPINE COMPLETE 4+V
5 series · 5 of 5 positions shown · non-contrast
Comparison: None in PACs

CLINICAL DATA: One week of generalized back pain. No report of
injury.

EXAM:
LUMBAR SPINE - COMPLETE 4+ VIEW

[t l-spine a.p.]
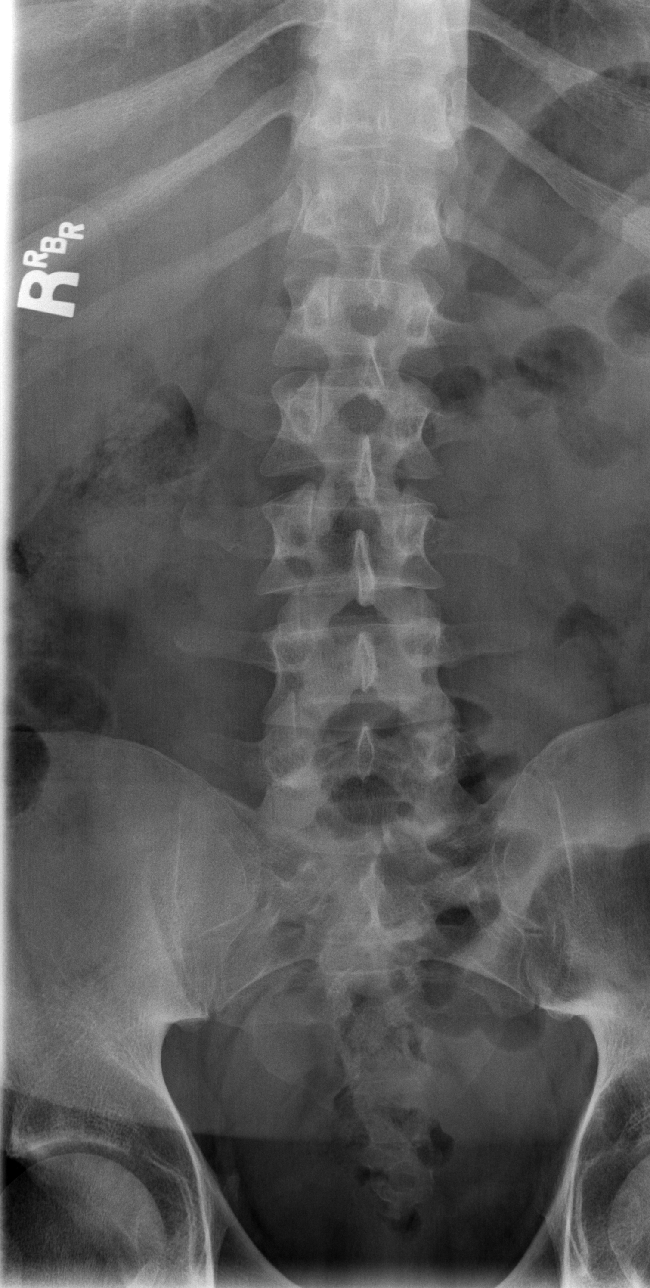

[t l-spine oblique exposure (1 of 2)]
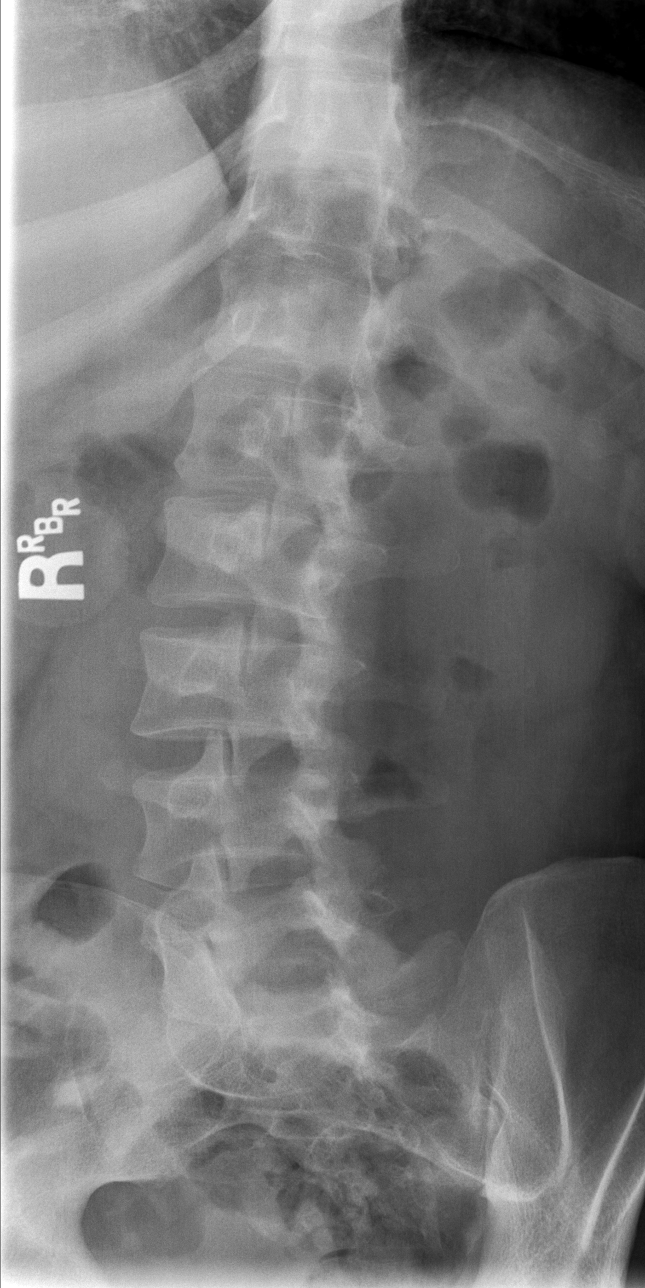

[t l-spine oblique exposure (2 of 2)]
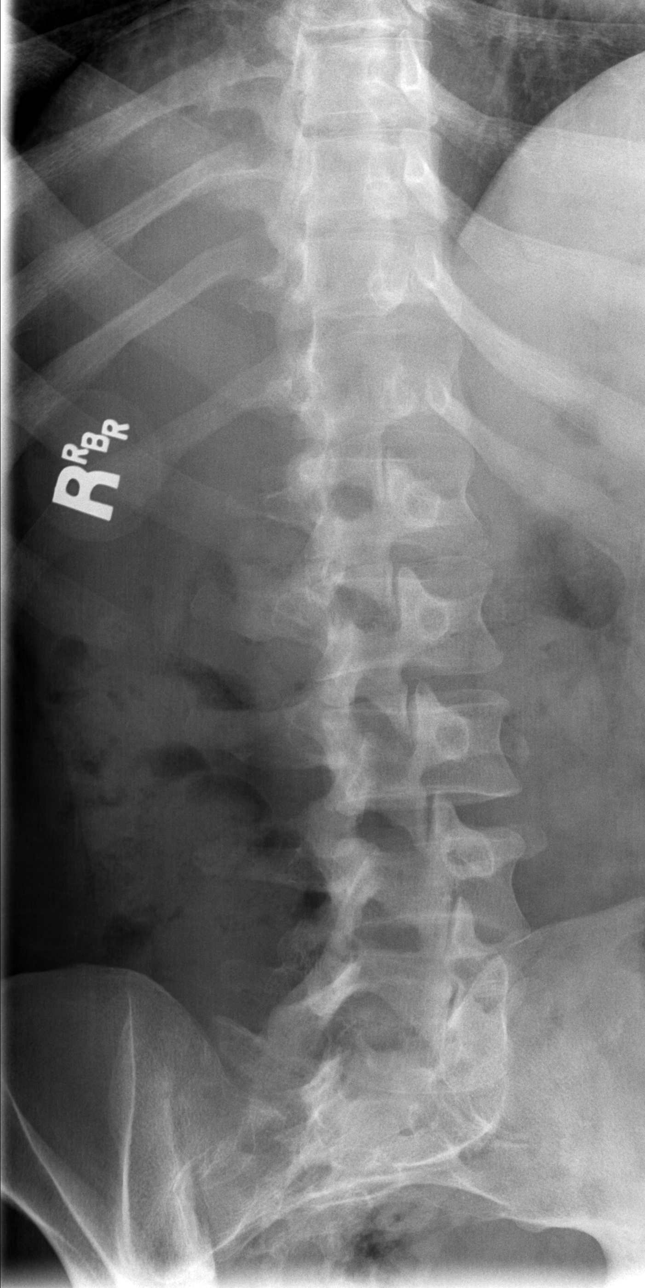

[t l-spine lat]
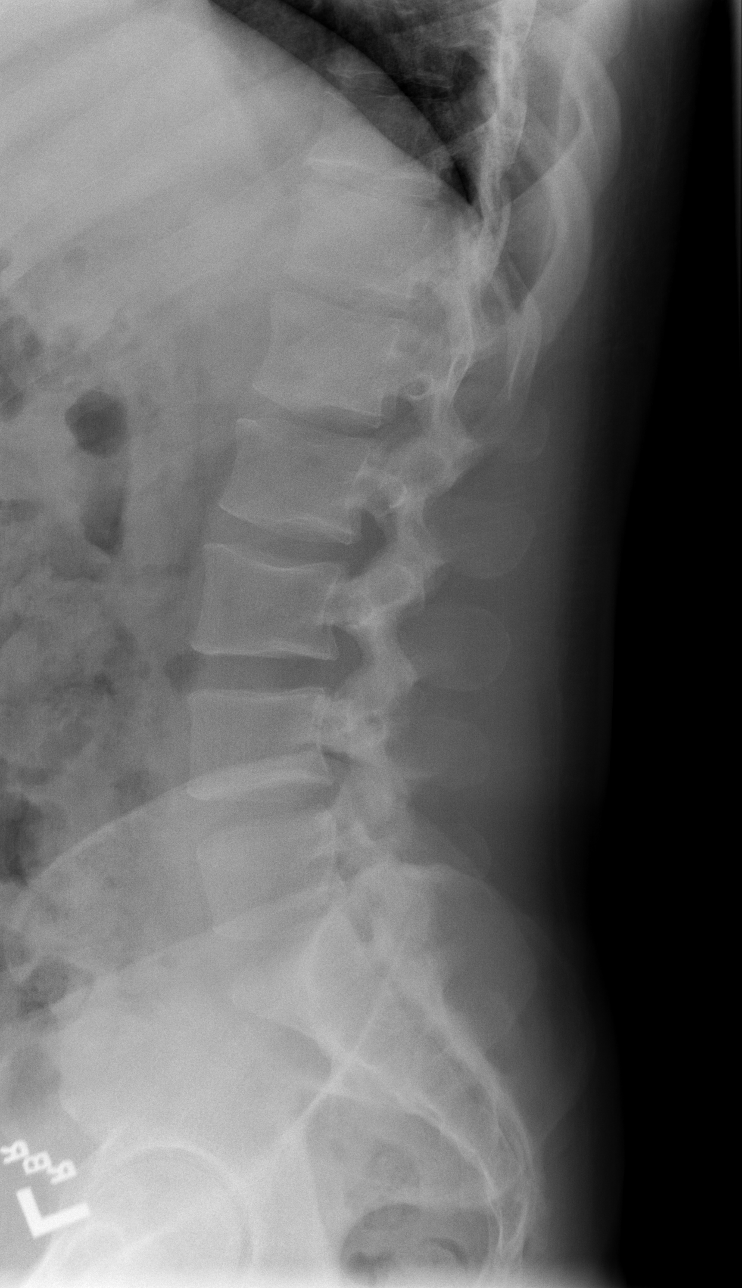

[t l-spine l5-s1 spot]
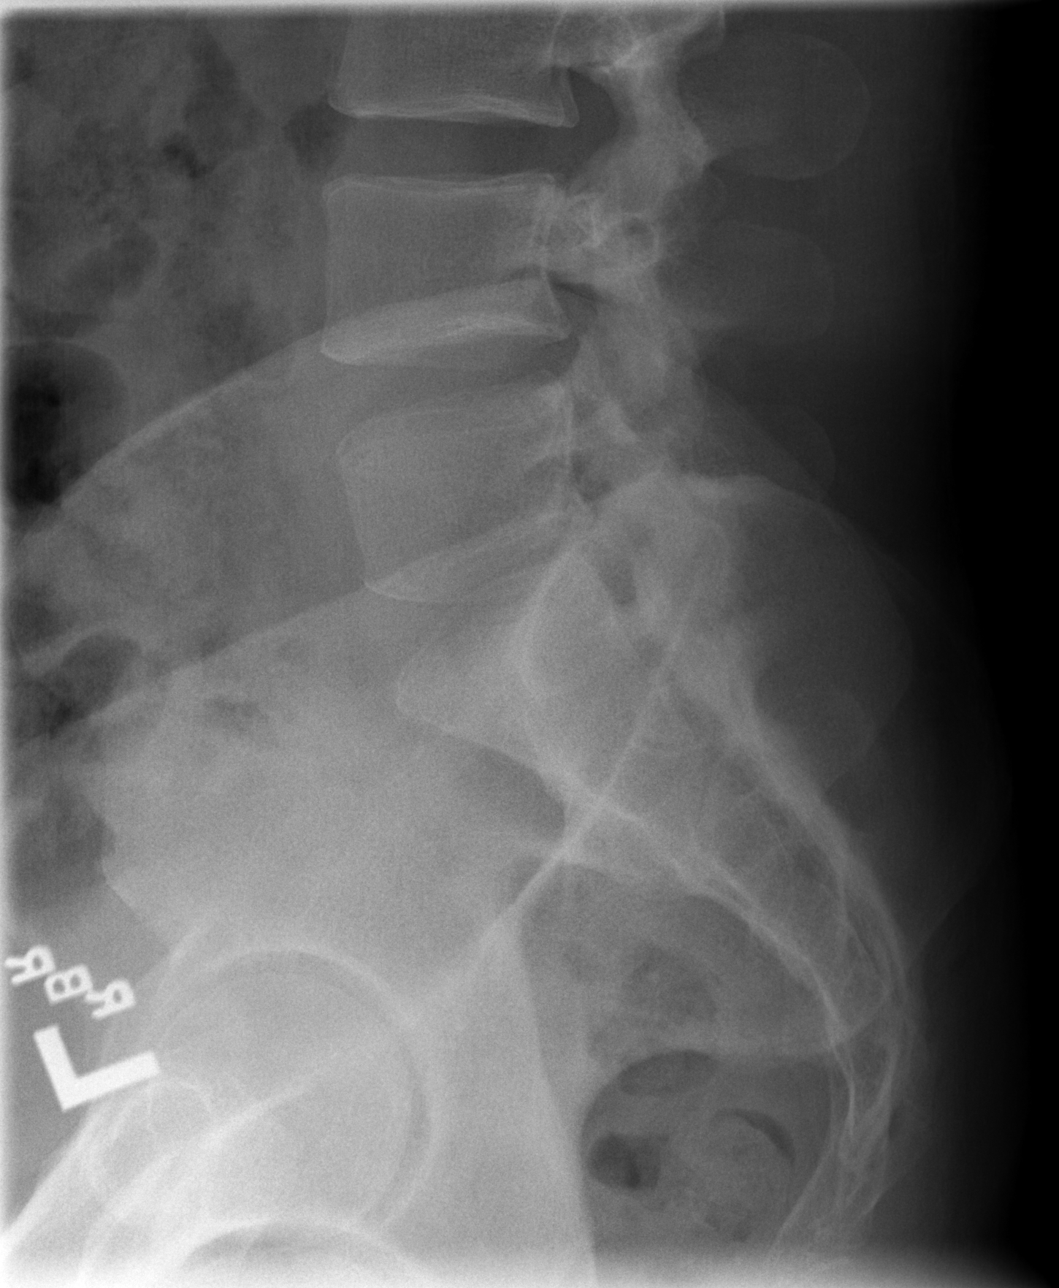

[5 of 5 positions shown; findings below may reference images not displayed]

FINDINGS: The lumbar vertebral bodies are preserved in height. There is gentle
curvature of the lumbar spine convex toward the right which is a
part of a gentle reverse S shaped thoracolumbar curvature. The
pedicles and transverse processes are intact. The disc space heights
are reasonably well-maintained. There is no spondylolisthesis. There
is no significant facet joint hypertrophy.
IMPRESSION: There is no acute or significant chronic bony abnormality of the
lumbar spine.
